# Patient Record
Sex: Female | Born: 1985 | Hispanic: Yes | Marital: Married | Smoking: Never smoker
Health system: Southern US, Community
[De-identification: ages and names within clinical notes are randomized; demographics above are authoritative.]

---

## 2019-08-21 ENCOUNTER — Ambulatory Visit: Payer: Self-pay | Attending: Internal Medicine

## 2019-08-21 DIAGNOSIS — Z23 Encounter for immunization: Secondary | ICD-10-CM

## 2019-08-21 NOTE — Progress Notes (Signed)
   Covid-19 Vaccination Clinic  Name:  Latoya Webster    MRN: [id number] DOB: 1986-01-03  08/21/2019  Ms. Latoya Webster was observed post Covid-19 immunization for 15 minutes without incident. She was provided with Vaccine Information Sheet and instruction to access the V-Safe system.   Ms. Latoya Webster was instructed to call 911 with any severe reactions post vaccine: Marland Kitchen Difficulty breathing  . Swelling of face and throat  . A fast heartbeat  . A bad rash all over body  . Dizziness and weakness   Immunizations Administered    Name Date Dose VIS Date Route   Pfizer COVID-19 Vaccine 08/21/2019  5:17 PM 0.3 mL 05/14/2019 Intramuscular   Manufacturer: [employer], [employer]   Lot: [id number]   NDC: [address]-3

## 2019-09-11 ENCOUNTER — Ambulatory Visit: Payer: Self-pay | Attending: Internal Medicine

## 2019-09-21 ENCOUNTER — Ambulatory Visit: Payer: Self-pay | Attending: Internal Medicine

## 2019-09-21 DIAGNOSIS — Z23 Encounter for immunization: Secondary | ICD-10-CM

## 2019-09-21 NOTE — Progress Notes (Signed)
   Covid-19 Vaccination Clinic  Name:  Latoya Webster    MRN: [id number] DOB: [birth year]  09/21/2019  Ms. Latoya Webster was observed post Covid-19 immunization for 15 minutes without incident. She was provided with Vaccine Information Sheet and instruction to access the V-Safe system.   Ms. Latoya Webster was instructed to call 911 with any severe reactions post vaccine: Marland Kitchen Difficulty breathing  . Swelling of face and throat  . A fast heartbeat  . A bad rash all over body  . Dizziness and weakness   Immunizations Administered    Name Date Dose VIS Date Route   Pfizer COVID-19 Vaccine 09/21/2019  8:34 AM 0.3 mL 07/28/2018 Intramuscular   Manufacturer: [employer], [employer]   Lot: [id number]   NDC: [address]-2

## 2019-11-06 ENCOUNTER — Encounter: Payer: Self-pay | Admitting: Emergency Medicine

## 2019-11-06 ENCOUNTER — Emergency Department
Admission: EM | Admit: 2019-11-06 | Discharge: 2019-11-06 | Disposition: A | Discharge status: Home / Self Care | Payer: Self-pay | Attending: Student | Admitting: Student

## 2019-11-06 ENCOUNTER — Emergency Department: Payer: Self-pay

## 2019-11-06 ENCOUNTER — Other Ambulatory Visit: Payer: Self-pay

## 2019-11-06 DIAGNOSIS — R509 Fever, unspecified: Secondary | ICD-10-CM

## 2019-11-06 DIAGNOSIS — Z20822 Contact with and (suspected) exposure to covid-19: Secondary | ICD-10-CM | POA: Insufficient documentation

## 2019-11-06 DIAGNOSIS — N390 Urinary tract infection, site not specified: Secondary | ICD-10-CM | POA: Insufficient documentation

## 2019-11-06 LAB — COMPREHENSIVE METABOLIC PANEL
ALT: 14 U/L (ref 0–44)
AST: 27 U/L (ref 15–41)
Albumin: 4.4 g/dL (ref 3.5–5.0)
Alkaline Phosphatase: 87 U/L (ref 38–126)
Anion gap: 14 (ref 5–15)
BUN: 10 mg/dL (ref 6–20)
CO2: 23 mmol/L (ref 22–32)
Calcium: 8.9 mg/dL (ref 8.9–10.3)
Chloride: 98 mmol/L (ref 98–111)
Creatinine, Ser: 0.78 mg/dL (ref 0.44–1.00)
GFR calc Af Amer: >60 mL/min (ref >60)
GFR calc non Af Amer: >60 mL/min (ref >60)
Glucose, Bld: 92 mg/dL (ref 70–99)
Potassium: 3.7 mmol/L (ref 3.5–5.1)
Sodium: 135 mmol/L (ref 135–145)
Total Bilirubin: 1.6 mg/dL — ABNORMAL HIGH (ref 0.3–1.2)
Total Protein: 8 g/dL (ref 6.5–8.1)

## 2019-11-06 LAB — URINALYSIS, COMPLETE (UACMP) WITH MICROSCOPIC
Bacteria, UA: NONE SEEN
Bilirubin Urine: NEGATIVE
Glucose, UA: NEGATIVE mg/dL
Hgb urine dipstick: NEGATIVE
Ketones, ur: NEGATIVE mg/dL
Nitrite: NEGATIVE
Protein, ur: NEGATIVE mg/dL
Specific Gravity, Urine: 1.008 (ref 1.005–1.030)
pH: 8 (ref 5.0–8.0)

## 2019-11-06 LAB — CBC WITH DIFFERENTIAL/PLATELET
Abs Immature Granulocytes: 0.03 10*3/uL (ref 0.00–0.07)
Basophils Absolute: 0 10*3/uL (ref 0.0–0.1)
Basophils Relative: 0 %
Eosinophils Absolute: 0 10*3/uL (ref 0.0–0.5)
Eosinophils Relative: 0 %
HCT: 36.2 % (ref 36.0–46.0)
Hemoglobin: 11.9 g/dL — ABNORMAL LOW (ref 12.0–15.0)
Immature Granulocytes: 0 %
Lymphocytes Relative: 6 %
Lymphs Abs: 0.6 10*3/uL — ABNORMAL LOW (ref 0.7–4.0)
MCH: 27.2 pg (ref 26.0–34.0)
MCHC: 32.9 g/dL (ref 30.0–36.0)
MCV: 82.8 fL (ref 80.0–100.0)
Monocytes Absolute: 0.2 10*3/uL (ref 0.1–1.0)
Monocytes Relative: 2 %
Neutro Abs: 9.2 10*3/uL — ABNORMAL HIGH (ref 1.7–7.7)
Neutrophils Relative %: 92 %
Platelets: 203 10*3/uL (ref 150–400)
RBC: 4.37 MIL/uL (ref 3.87–5.11)
RDW: 13.3 % (ref 11.5–15.5)
WBC: 10 10*3/uL (ref 4.0–10.5)
nRBC: 0 % (ref 0.0–0.2)

## 2019-11-06 LAB — LACTIC ACID, PLASMA
Lactic Acid, Venous: 2 mmol/L (ref 0.5–1.9)
Lactic Acid, Venous: 1.2 mmol/L (ref 0.5–1.9)

## 2019-11-06 LAB — PROTIME-INR
INR: 1.1 (ref 0.8–1.2)
Prothrombin Time: 13.3 seconds (ref 11.4–15.2)

## 2019-11-06 LAB — POCT PREGNANCY, URINE: Preg Test, Ur: NEGATIVE

## 2019-11-06 LAB — SARS CORONAVIRUS 2 BY RT PCR (HOSPITAL ORDER, PERFORMED IN ~~LOC~~ HOSPITAL LAB): SARS Coronavirus 2: NEGATIVE

## 2019-11-06 IMAGING — CR DG CHEST 2V
2 series · 2 of 2 positions shown · non-contrast
Comparison: None.

CLINICAL DATA: Suspected sepsis. LOWER back pain, headache, fever
started today.

EXAM:
CHEST - 2 VIEW

[chest pa]
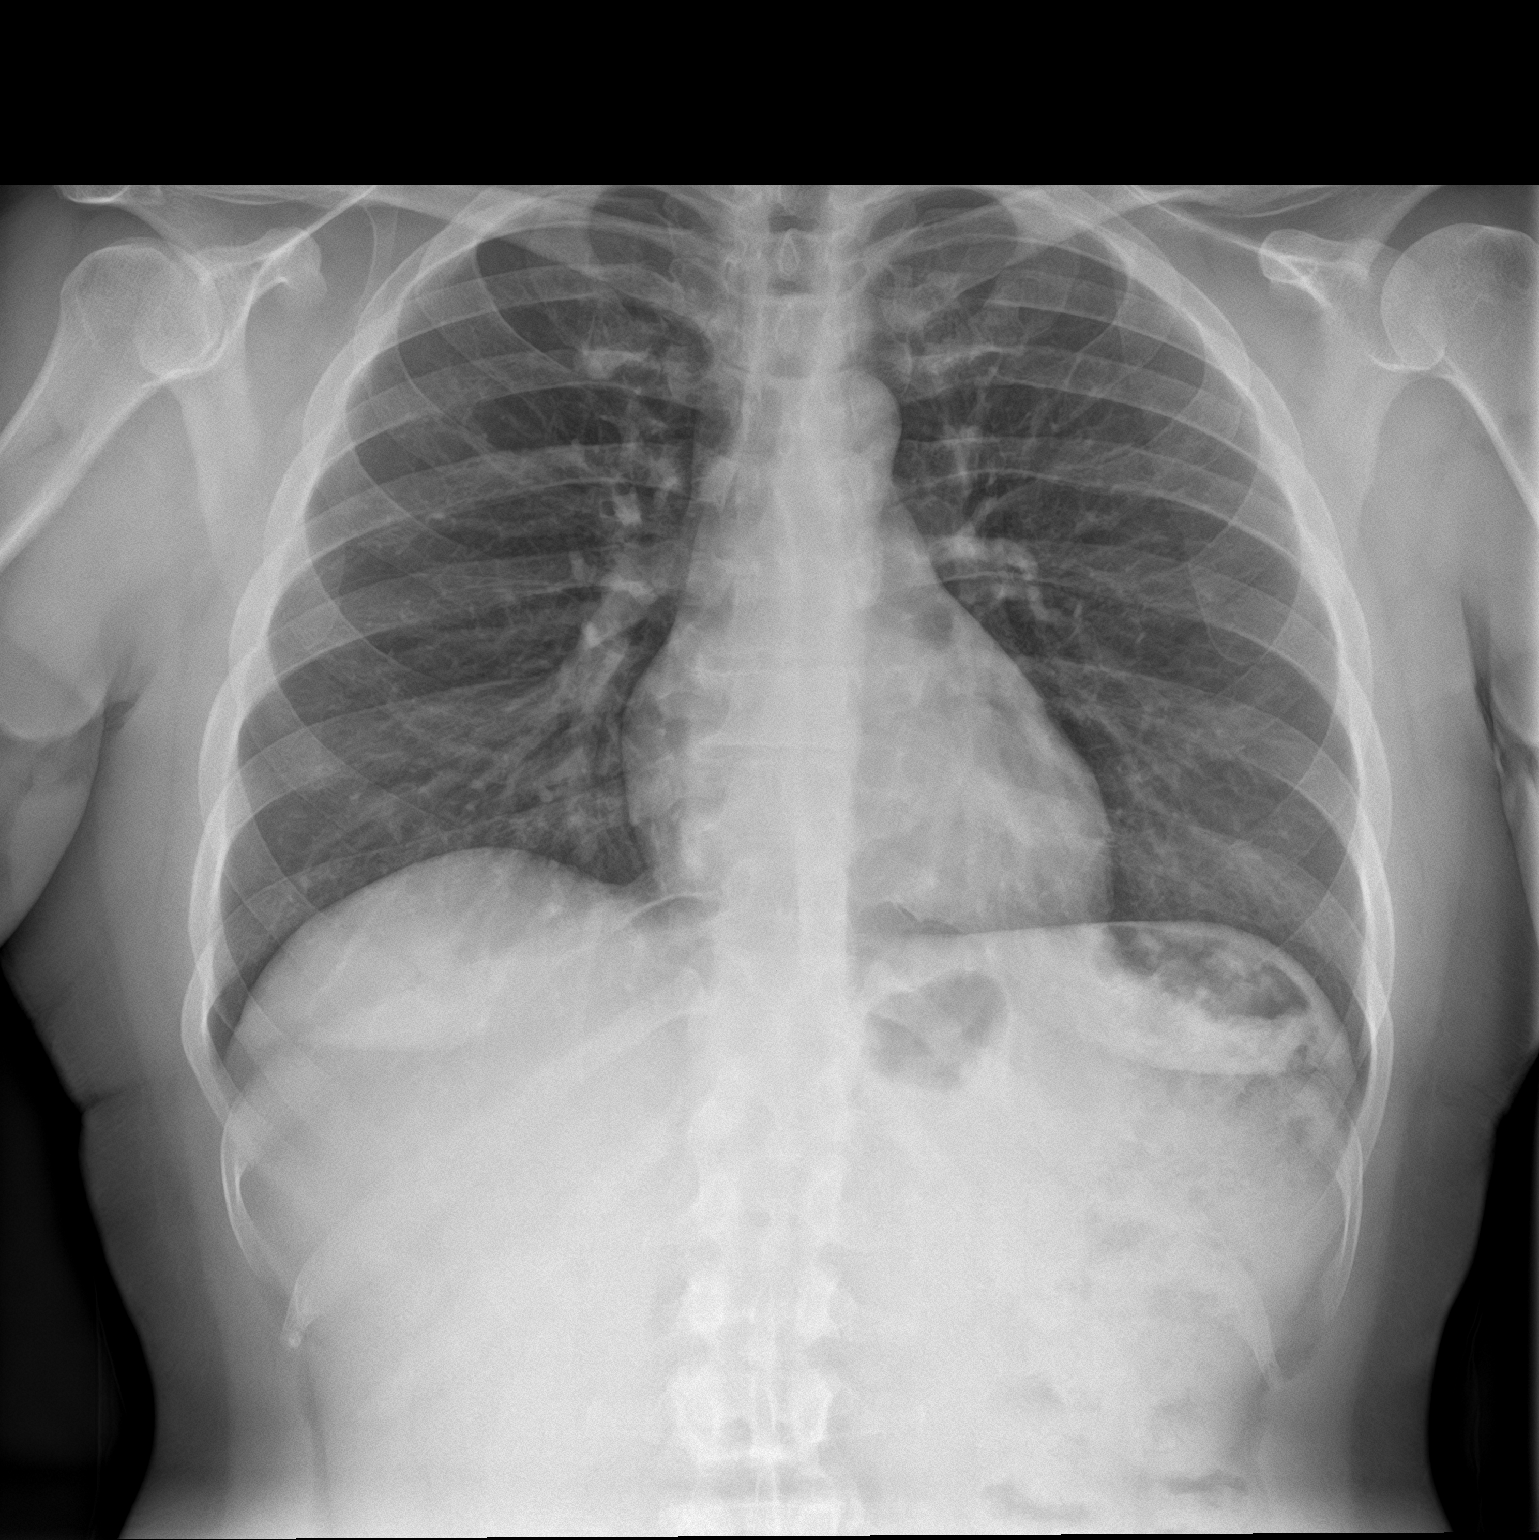

[chest lat]
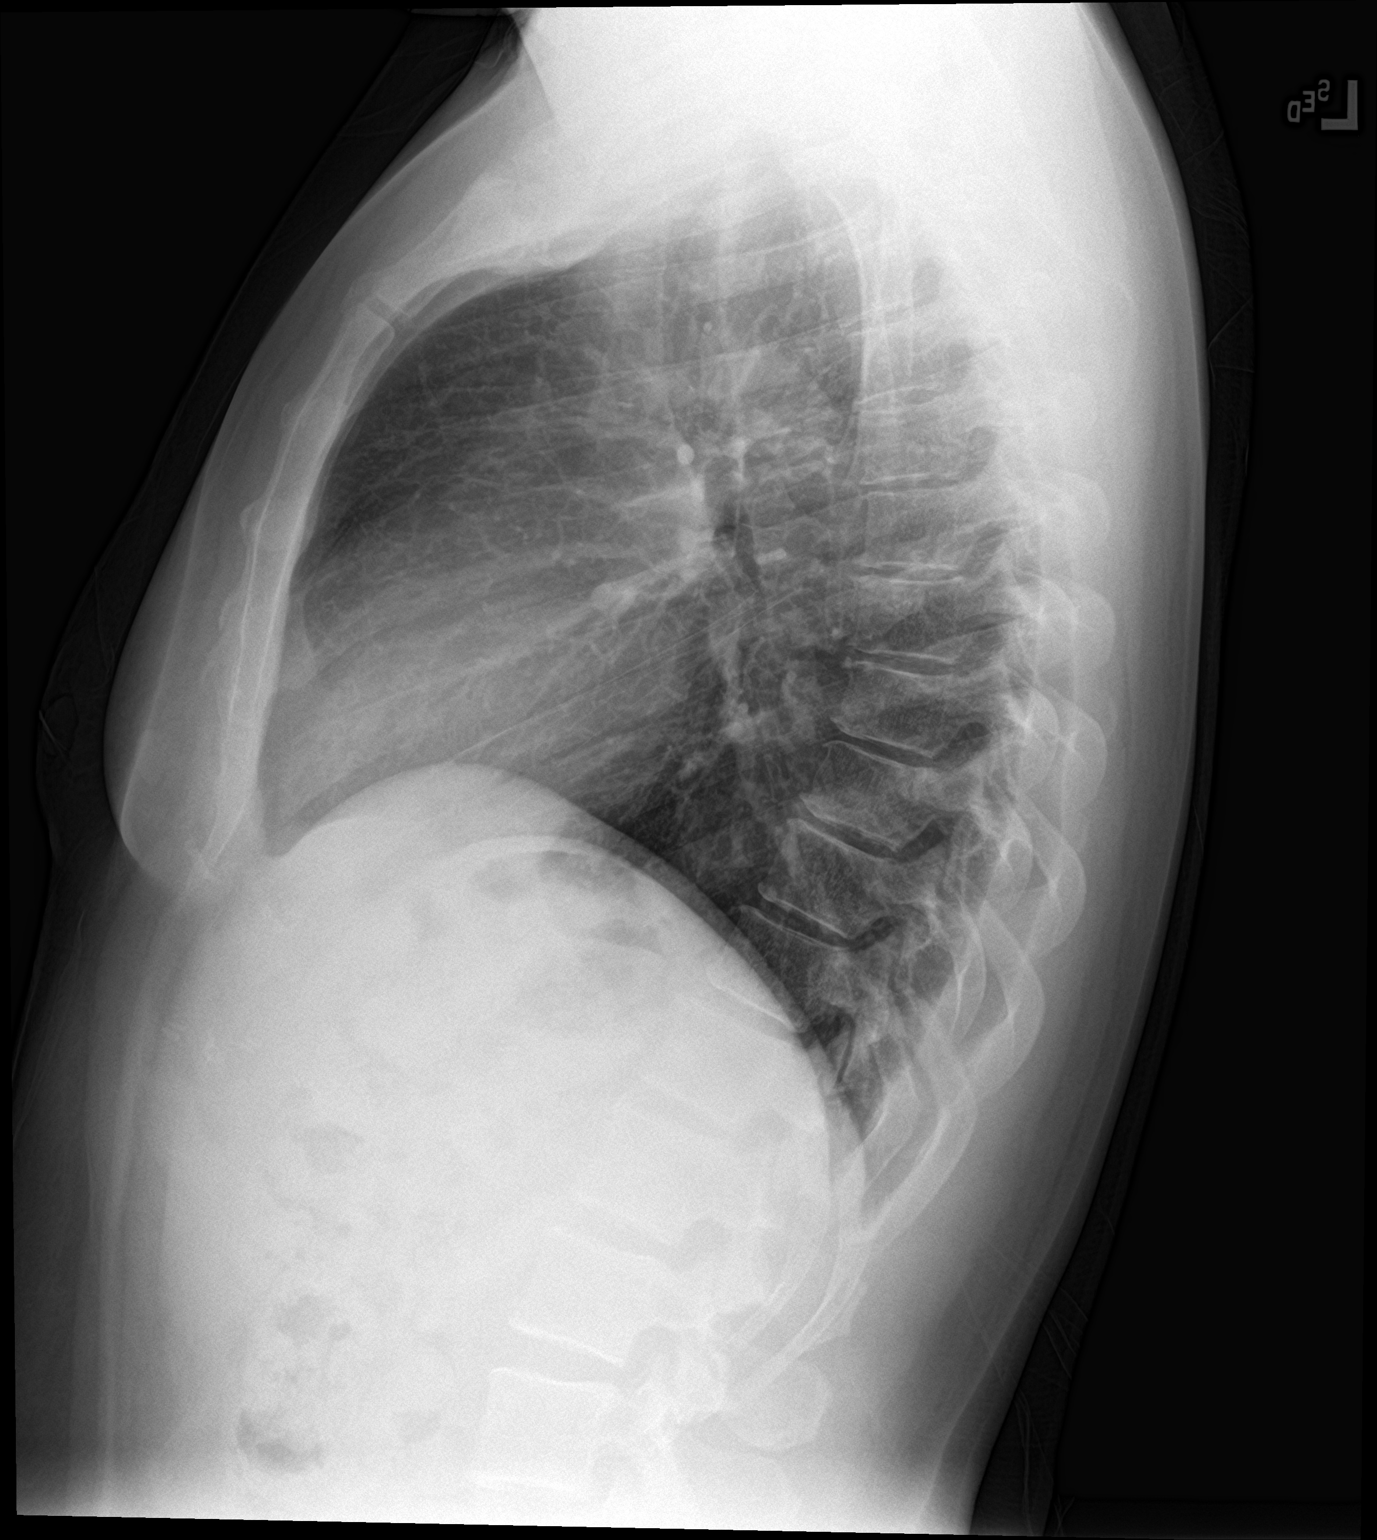

[2 of 2 positions shown; findings below may reference images not displayed]

FINDINGS: The heart size and mediastinal contours are within normal limits.
Both lungs are clear. The visualized skeletal structures are
unremarkable.
IMPRESSION: No active cardiopulmonary disease.

## 2019-11-06 MED ORDER — CEPHALEXIN 500 MG PO CAPS: 500.0000 mg | ORAL_CAPSULE | Freq: Four times a day (QID) | ORAL | 0 refills | Status: AC

## 2019-11-06 MED ORDER — IOHEXOL 300 MG/ML  SOLN
100.0000 mL | Freq: Once | INTRAMUSCULAR | Status: AC | PRN
  Administered 2019-11-06: 100 mL via INTRAVENOUS

## 2019-11-06 MED ORDER — ACETAMINOPHEN 325 MG PO TABS
650.0000 mg | ORAL_TABLET | Freq: Once | ORAL | Status: AC
  Administered 2019-11-06: 650 mg via ORAL
  Filled 2019-11-06: qty 2

## 2019-11-06 MED ORDER — SODIUM CHLORIDE 0.9 % IV SOLN
1.0000 g | Freq: Once | INTRAVENOUS | Status: AC
  Administered 2019-11-06: 1 g via INTRAVENOUS
  Filled 2019-11-06: qty 10

## 2019-11-06 MED ORDER — SODIUM CHLORIDE 0.9 % IV BOLUS
1000.0000 mL | Freq: Once | INTRAVENOUS | Status: AC
  Administered 2019-11-06: 1000 mL via INTRAVENOUS

## 2019-11-06 NOTE — ED Notes (Signed)
Patient changed into gown and given a warm blanket.

## 2019-11-06 NOTE — ED Triage Notes (Signed)
Pt to ED via POV c/o lower back pain, headache, and fever. Pt is febrile in triage. Pt states that about 4 days ago she had burning with urination but that has since resolved. Pt denies sick contacts.

## 2019-11-06 NOTE — Discharge Instructions (Signed)
Thank you for letting us take care of you in the emergency department today.   Please continue to take any regular, prescribed medications. Take ibuprofen and/or Tylenol as directed on the box to help with pain and fever control.   New medications we have prescribed:  Keflex, for urine infection  Please follow up with: Your primary care doctor to review your ER visit and follow up on your symptoms.   Please return to the ER for any new or worsening symptoms.    Gracias por dejarnos atenderlo en el departamento de emergencias hoy.  Contine tomando cualquier medicamento recetado con regularidad. Tome ibuprofeno y / o Tylenol como se indica en la caja para ayudar a Human resources officer y la [place].  Nuevos medicamentos que le hemos recetado: 1. Keflex, para infecciones de orina  Contine con: 1. Su mdico de atencin primaria para revisar su visita a la sala de emergencias y Radio producer un seguimiento de sus sntomas.  Regrese a la sala de emergencias si tiene sntomas nuevos o que empeoran.

## 2019-11-06 NOTE — ED Provider Notes (Signed)
[facility] Emergency Department Provider Note  ____________________________________________   First MD Initiated Contact with Patient 11/06/19 1049     (approximate)  I have reviewed the triage vital signs and the nursing notes.  History  Chief Complaint Chills and Back Pain    HPI Latoya Webster is a 34 y.o. female no significant past medical history who presents to the emergency department for fever, headache, backache, dysuria.  Symptoms first started 4 days ago, have been constant since onset, without relief.  Describes her headache (generalized) and backache (low back) as an aching type pain, moderate in severity, no radiation, no alleviating/aggravating components.  Denies any nausea, vomiting, diarrhea. Has had dysuria x 4 days, similar to prior UTI.  Denies any sick contacts.  Has been vaccinated against COVID.  Reports symptoms feel similar to when she has been diagnosed with UTI before. No vaginal discharge, pelvic pain, or exposure to STD.   Past Medical Hx History reviewed. No pertinent past medical history.  Problem List There are no problems to display for this patient.   Past Surgical Hx History reviewed. No pertinent surgical history.  Medications Prior to Admission medications   Not on File    Allergies Patient has no known allergies.  Family Hx No family history on file.  Social Hx Social History   Tobacco Use  . Smoking status: Never Smoker  . Smokeless tobacco: Never Used  Substance Use Topics  . Alcohol use: Not Currently  . Drug use: Not Currently     Review of Systems  Constitutional: Positive for fever, body aches. Eyes: Negative for visual changes. ENT: Negative for sore throat. Cardiovascular: Negative for chest pain. Respiratory: Negative for shortness of breath. Gastrointestinal: Negative for nausea. Negative for vomiting.  Genitourinary: Positive for dysuria. Musculoskeletal: Negative for leg  swelling. Skin: Negative for rash. Neurological: Positive for headaches.   Physical Exam  Vital Signs: ED Triage Vitals  Enc Vitals Group     BP 11/06/19 0918 115/87     Pulse Rate 11/06/19 0918 (!) 102     Resp 11/06/19 0918 18     Temp 11/06/19 0918 (!) 101.8 F (38.8 C)     Temp Source 11/06/19 0918 Oral     SpO2 11/06/19 0918 100 %     Weight 11/06/19 0924 156 lb (70.8 kg)     Height --      Head Circumference --      Peak Flow --      Pain Score 11/06/19 0923 9     Pain Loc --      Pain Edu? --      Excl. in [place]? --     Constitutional: Alert and oriented. Well appearing. NAD.  Head: Normocephalic. Atraumatic. Eyes: Conjunctivae clear. Sclera anicteric. Pupils equal and symmetric. Nose: No masses or lesions. No congestion or rhinorrhea. Mouth/Throat: Wearing mask.  Neck: No stridor. Trachea midline.  Cardiovascular: Mild tachycardia, regular rhythm. Extremities well perfused. Respiratory: Normal respiratory effort.  Lungs CTAB. Gastrointestinal: Soft. Non-distended. Non-tender. No CVA tenderness. Genitourinary: Deferred. Musculoskeletal: No lower extremity edema. No deformities. Neurologic:  Normal speech and language. No gross focal or lateralizing neurologic deficits are appreciated.  Skin: Skin is warm, dry and intact. No rash noted. Febrile on arrival.  Psychiatric: Mood and affect are appropriate for situation.   Radiology  Personally reviewed available imaging myself.   CXR - IMPRESSION:  No active cardiopulmonary disease.   CT A/P - IMPRESSION:  1. Small amount  of nonspecific free pelvic fluid in the cul-de-sac,  possibly physiologic.  2. Normal appendix.  3. A specific cause for the patient's low back pain and fever is not  identified.    Procedures  Procedure(s) performed (including critical care):  Procedures   Initial Impression / Assessment and Plan / MDM / ED Course  34 y.o. female who presents to the ED for fever, headache, backache,  body ache, dysuria.  Ddx: UTI, pyelonephritis, appendicitis, viral process, COVID (though she has been vaccinated against COVID). She has no complaints of vaginal discharge, no pelvic pain, no STD exposure, therefore doubt gynecologic etiology.   Will plan for labs, including cultures, lactic, CXR, COVID swab. Fluids, symptom control, and reassess.  Normal white count. Electrolytes w/o actionable derangements. COVID negative. Initial lactic mildly elevated, improved after fluids on recheck. UA suggestive of infection with LE, WBC, especially in the setting of her symptoms. CT negative for acute cause of patient's symptoms. Will give 1st dose antibiotics here and then plan for discharge with course of oral antibiotics, she has had no difficulty tolerating PO. She is comfortable w/ the plan. VS improved after fluids and APAP. Discussed results and plan of care w/ interpretor at bedside. Given return precautions.    _______________________________   As part of my medical decision making I have reviewed available labs, radiology tests, reviewed old records/performed chart review.    Final Clinical Impression(s) / ED Diagnosis  UTI  Fever in adult    Note:  This document was prepared using Dragon voice recognition software and may include unintentional dictation errors.   Miguel Aschoff., MD 11/06/19 [facility]

## 2019-11-06 NOTE — ED Notes (Signed)
Pt ambulatory to room from triage with Lorin Picket, EDT. Interpreter requested by this RN.

## 2019-11-08 LAB — URINE CULTURE: Culture: >=100000 — AB

## 2019-11-11 LAB — CULTURE, BLOOD (ROUTINE X 2)
Culture: NO GROWTH
Culture: NO GROWTH
Special Requests: ADEQUATE
Special Requests: ADEQUATE
